# Patient Record
Sex: Male | Born: 1980 | Race: Asian | Hispanic: No | Marital: Married | Smoking: Never smoker
Health system: Southern US, Community
[De-identification: ages and names within clinical notes are randomized; demographics above are authoritative.]

## PROBLEM LIST (undated history)

## (undated) DIAGNOSIS — I509 Heart failure, unspecified: Secondary | ICD-10-CM

## (undated) DIAGNOSIS — I219 Acute myocardial infarction, unspecified: Secondary | ICD-10-CM

## (undated) HISTORY — DX: Acute myocardial infarction, unspecified: I21.9

## (undated) HISTORY — DX: Heart failure, unspecified: I50.9

---

## 2012-07-31 ENCOUNTER — Ambulatory Visit: Payer: BC Managed Care – PPO

## 2012-07-31 ENCOUNTER — Ambulatory Visit (INDEPENDENT_AMBULATORY_CARE_PROVIDER_SITE_OTHER): Payer: BC Managed Care – PPO | Admitting: Emergency Medicine

## 2012-07-31 VITALS — BP 100/66 | HR 70 | Temp 97.2°F | Resp 16 | Ht 65.5 in | Wt 114.4 lb

## 2012-07-31 DIAGNOSIS — R109 Unspecified abdominal pain: Secondary | ICD-10-CM

## 2012-07-31 DIAGNOSIS — R1013 Epigastric pain: Secondary | ICD-10-CM

## 2012-07-31 DIAGNOSIS — B9681 Helicobacter pylori [H. pylori] as the cause of diseases classified elsewhere: Secondary | ICD-10-CM

## 2012-07-31 LAB — POCT CBC
Hemoglobin: 13.4 g/dL — AB (ref 14.1–18.1)
Lymph, poc: 2.7 (ref 0.6–3.4)
MCH, POC: 20.8 pg — AB (ref 27–31.2)
MCHC: 30.8 g/dL — AB (ref 31.8–35.4)
MID (cbc): 0.5 (ref 0–0.9)
MPV: 9.5 fL (ref 0–99.8)
POC Granulocyte: 3.7 (ref 2–6.9)
POC LYMPH PERCENT: 39.1 %L (ref 10–50)
POC MID %: 6.8 %M (ref 0–12)
Platelet Count, POC: 291 10*3/uL (ref 142–424)
RDW, POC: 17.1 %
WBC: 6.9 10*3/uL (ref 4.6–10.2)

## 2012-07-31 MED ORDER — LANSOPRAZOLE 30 MG PO CPDR: 30.0000 mg | DELAYED_RELEASE_CAPSULE | Freq: Every day | ORAL | Status: DC

## 2012-07-31 NOTE — Progress Notes (Signed)
[facility] [address], [place] [address] [phone]  Date:  07/31/2012   Name:  Dustin White   DOB:  11-Nov-1980   MRN:  [id number]  PCP:  No primary provider on file.    Chief Complaint: Abdominal Pain   History of Present Illness:  Dustin White is a 31 y.o. very pleasant male patient who presents with the following:  Has one week history of pain in LUQ and left chest that worsens with food and causes vomiting if he eats too much.  Nauseated and vomiting.  Some loose stools.  No blood mucous or pus in stools.  No ETOH use, no XS ASA or NSAID.  No xs caffeine.  No history of prior episodes or ulcer disease.  There is no problem list on file for this patient.   Past Medical History  Diagnosis Date  . Myocardial infarction   . CHF (congestive heart failure)     History reviewed. No pertinent past surgical history.  History  Substance Use Topics  . Smoking status: Never Smoker   . Smokeless tobacco: Not on file  . Alcohol Use: No    History reviewed. No pertinent family history.  No Known Allergies  Medication list has been reviewed and updated.  No current outpatient prescriptions on file prior to visit.    Review of Systems:  GEN: WDWN, NAD, Non-toxic, A & O x 3 HEENT: Atraumatic, Normocephalic. Neck supple. No masses, No LAD. Ears and Nose: No external deformity. CV: RRR, No M/G/R. No JVD. No thrill. No extra heart sounds. PULM: CTA B, no wheezes, crackles, rhonchi. No retractions. No resp. distress. No accessory muscle use. ABD: S, tender across upper abdomen, ND, +BS. No rebound. No HSM. EXTR: No c/c/e NEURO Normal gait.  PSYCH: Normally interactive. Conversant. Not depressed or anxious appearing.  Calm demeanor.    Physical Examination: Filed Vitals:   07/31/12 1636  BP: 100/66  Pulse: 70  Temp: 97.2 F (36.2 C)  Resp: 16   Filed Vitals:   07/31/12 1636  Height: 5' 5.5" (1.664 m)  Weight: 114 lb 6.4 oz (51.891 kg)    Body mass index is 18.75 kg/(m^2). Ideal Body Weight: Weight in (lb) to have BMI = 25: 152.2   GEN: WDWN, NAD, Non-toxic, A & O x 3 HEENT: Atraumatic, Normocephalic. Neck supple. No masses, No LAD. Ears and Nose: No external deformity. CV: RRR, No M/G/R. No JVD. No thrill. No extra heart sounds. PULM: CTA B, no wheezes, crackles, rhonchi. No retractions. No resp. distress. No accessory muscle use. ABD: S, tender across upper abdomen worse in RUQ, ND, +BS. No rebound. No HSM. EXTR: No c/c/e NEURO Normal gait.  PSYCH: Normally interactive. Conversant. Not depressed or anxious appearing.  Calm demeanor.     Assessment and Plan:  Abdominal pain Will check labs and if negative obtain ultrasound of abdomen Empirically start on prevacid Follow up in one week  Carmelina Dane, MD  [facility] reading (PRIMARY) by  Dr. Dareen Piano.  Unremarkable abdomen and chest.    Results for orders placed in visit on 07/31/12  POCT CBC      Component Value Range   WBC 6.9  4.6 - 10.2 K/uL   Lymph, poc 2.7  0.6 - 3.4   POC LYMPH PERCENT 39.1  10 - 50 %L   MID (cbc) 0.5  0 - 0.9   POC MID % 6.8  0 - 12 %M   POC Granulocyte 3.7  2 - 6.9   Granulocyte percent 54.1  37 - 80 %G   RBC 6.43 (*) 4.69 - 6.13 M/uL   Hemoglobin 13.4 (*) 14.1 - 18.1 g/dL   HCT, POC 16.1  09.6 - 53.7 %   MCV 67.7 (*) 80 - 97 fL   MCH, POC 20.8 (*) 27 - 31.2 pg   MCHC 30.8 (*) 31.8 - 35.4 g/dL   RDW, POC 04.5     Platelet Count, POC 291  142 - 424 K/uL   MPV 9.5  0 - 99.8 fL

## 2012-07-31 NOTE — Patient Instructions (Signed)
?  au B?ng (Abdominal Pain) ?au b?ng ho?c ?au d? dy c th? do r?t nhi?u nguyn nhn. Bc s? c?a b?n quy?t ??nh m?c ?? nghim tr?ng c?a c?n ?au c?a b?n b?ng cch khm, v c th? lm xt nghi?m mu v ch?p X-quang. R?t nhi?u tr??ng h?p c th? ???c theo di v ?i?u tr? t?i nh. ?a s? ?au b?ng ? tr? em l b?nh ch?c n?ng. ?i?u ny ny c ngh?a ?au b?ng khng ph?i l do b?nh v c th? s? ?? m khng c?n ?i?u tr?Mickie Hillier nhin, trong nhi?u tr??ng h?p, ph?i m?t nhi?u th?i gian h?n tr??c khi nguyn nhn r rng c?a c?n ?au c th? ???c tm th?y. Tr??c th?i ?i?m ?, c th? khng bi?t l b?n c c?n lm thm cc xt nghi?m, ho?c c c?n nh?p vi?n hay ph?u thu?t khng. H??NG D?N CH?M [place] T?I NH  Khng u?ng ho?c cho u?ng thu?c nhu?n trng, tr? khi ???c bc s? yu c?u.  Ch? u?ng thu?c gi?m ?au n?u ???c bc s? yu c?u.  Ch? dng cc thu?c k ??n ho?c khng k ??n ?? gi?m ?au, kh ch?u ho?c s?t theo ch? d?n c?a Bc s?.  C? g?ng n theo ch? ? n u?ng c ch?t l?ng khng c ci - n?c th?t h?m, n?c th?t ng, ch, ho?c n?c trong mi?n l theo s? ch? d?n c?a chuyn gia ch?m [place] y t? c?a b?n. B?n c th? d?n d?n chuy?n sang ch? ?? ?n nh?t n?u b?nh nhn tiu ha ???c. HY NGAY L?P T?C THAM V?N V?I CHUYN GIA Y T? N?U:  Khng h?t ?au.  B?n b? s?t.  Nn m?a nhi?u l?n.  B?t ??u ?au m?t ch? ? gc bn ph?i b?ng d??i (c th? l b?nh vim ru?t th?a), ho?c gc bn tri b?ng d??i ? ng??i l?n (c th? l vim ru?t k?t ho?c vim ti th?a).  i ngoi ra phn c mu (phn mu ? ti ho?c en nh h?c-n). HY CH?C CH?N R?NG B?N:  Hi?u r nh?ng h??ng d?n khi xu?t vi?n.  S? theo di tnh tr?ng b?nh c?a b?n.  S? ??n khm b?nh ngay l?p t?c nh? ? ???c h??ng d?n. Document Released: 07/23/2005 Document Revised: 10/15/2011 [facility] Patient Information 2013 [place], [place].

## 2012-08-01 ENCOUNTER — Telehealth: Payer: Self-pay | Admitting: Emergency Medicine

## 2012-08-01 LAB — COMPREHENSIVE METABOLIC PANEL
ALT: 21 U/L (ref 0–53)
AST: 19 U/L (ref 0–37)
Alkaline Phosphatase: 54 U/L (ref 39–117)
CO2: 29 mEq/L (ref 19–32)
Creat: 0.96 mg/dL (ref 0.50–1.35)
Sodium: 137 mEq/L (ref 135–145)
Total Bilirubin: 0.6 mg/dL (ref 0.3–1.2)
Total Protein: 7.6 g/dL (ref 6.0–8.3)

## 2012-08-01 MED ORDER — AMOXICILL-CLARITHRO-LANSOPRAZ PO MISC: ORAL | Status: DC

## 2012-08-01 NOTE — Addendum Note (Signed)
Addended by: Carmelina Dane on: 08/01/2012 02:16 PM   Modules accepted: Orders

## 2012-08-01 NOTE — Telephone Encounter (Signed)
Please let patient know he has a prescription to pick up for a prevpak.  We should see him back when he completes the medication.  thanks

## 2012-08-03 NOTE — Telephone Encounter (Signed)
He has gotten the Rx already

## 2012-09-20 ENCOUNTER — Other Ambulatory Visit: Payer: Self-pay | Admitting: Emergency Medicine

## 2013-02-03 ENCOUNTER — Ambulatory Visit (INDEPENDENT_AMBULATORY_CARE_PROVIDER_SITE_OTHER): Payer: BC Managed Care – PPO | Admitting: Internal Medicine

## 2013-02-03 VITALS — BP 100/72 | HR 61 | Temp 98.0°F | Resp 18 | Ht 65.0 in | Wt 109.0 lb

## 2013-02-03 DIAGNOSIS — R197 Diarrhea, unspecified: Secondary | ICD-10-CM

## 2013-02-03 DIAGNOSIS — Z603 Acculturation difficulty: Secondary | ICD-10-CM

## 2013-02-03 DIAGNOSIS — Z8619 Personal history of other infectious and parasitic diseases: Secondary | ICD-10-CM

## 2013-02-03 DIAGNOSIS — R634 Abnormal weight loss: Secondary | ICD-10-CM

## 2013-02-03 DIAGNOSIS — Z609 Problem related to social environment, unspecified: Secondary | ICD-10-CM

## 2013-02-03 DIAGNOSIS — Z789 Other specified health status: Secondary | ICD-10-CM

## 2013-02-03 LAB — POCT CBC

## 2013-02-03 LAB — POCT UA - MICROSCOPIC ONLY
Bacteria, U Microscopic: NEGATIVE
Casts, Ur, LPF, POC: NEGATIVE
WBC, Ur, HPF, POC: NEGATIVE
Yeast, UA: NEGATIVE

## 2013-02-03 LAB — TSH: TSH: 0.609 u[IU]/mL (ref 0.350–4.500)

## 2013-02-03 LAB — POCT URINALYSIS DIPSTICK
Blood, UA: NEGATIVE
Ketones, UA: NEGATIVE
Protein, UA: NEGATIVE
Spec Grav, UA: >=1.03
pH, UA: 5.5

## 2013-02-03 MED ORDER — MEBENDAZOLE 100 MG PO CHEW: 100.0000 mg | CHEWABLE_TABLET | Freq: Two times a day (BID) | ORAL | Status: DC

## 2013-02-03 NOTE — Patient Instructions (Addendum)
Intestinal Parasites (Worms) Your exam shows the presence of intestinal worms. Roundworms and tapeworms are common and cause a variety of symptoms: stomach cramps, diarrhea, nausea, passage of worms in the stool, and allergic reactions. Roundworms can cause hives, asthma, pneumonia, and blockage of the bowels.  Tapeworms usually come from undercooked meat (beef, pork, and fish). Roundworms are picked up through the soil. It is very important that you take the medicine you have been prescribed to destroy these parasites. It is also very important that you see your doctor in follow-up to evaluate the results of your treatment. Sometimes a second course of medicine is needed to get rid of the parasite worms. Document Released: 07/23/2005 Document Revised: 10/15/2011 Document Reviewed: 01/13/2007 [facility] Patient Information 2014 [place], [place]. Diarrhea Diarrhea is frequent loose and watery bowel movements. It can cause you to feel weak and dehydrated. Dehydration can cause you to become tired and thirsty, have a dry mouth, and have decreased urination that often is dark yellow. Diarrhea is a sign of another problem, most often an infection that will not last long. In most cases, diarrhea typically lasts 2 3 days. However, it can last longer if it is a sign of something more serious. It is important to treat your diarrhea as directed by your caregive to lessen or prevent future episodes of diarrhea. CAUSES  Some common causes include:  Gastrointestinal infections caused by viruses, bacteria, or parasites.  Food poisoning or food allergies.  Certain medicines, such as antibiotics, chemotherapy, and laxatives.  Artificial sweeteners and fructose.  Digestive disorders. HOME CARE INSTRUCTIONS  Ensure adequate fluid intake (hydration): have 1 cup (8 oz) of fluid for each diarrhea episode. Avoid fluids that contain simple sugars or sports drinks, fruit juices, whole milk products, and sodas. Your urine  should be clear or pale yellow if you are drinking enough fluids. Hydrate with an oral rehydration solution that you can purchase at pharmacies, retail stores, and online. You can prepare an oral rehydration solution at home by mixing the following ingredients together:    tsp table salt.   tsp baking soda.   tsp salt substitute containing potassium chloride.  1  tablespoons sugar.  1 L (34 oz) of water.  Certain foods and beverages may increase the speed at which food moves through the gastrointestinal (GI) tract. These foods and beverages should be avoided and include:  Caffeinated and alcoholic beverages.  High-fiber foods, such as raw fruits and vegetables, nuts, seeds, and whole grain breads and cereals.  Foods and beverages sweetened with sugar alcohols, such as xylitol, sorbitol, and mannitol.  Some foods may be well tolerated and may help thicken stool including:  Starchy foods, such as rice, toast, pasta, low-sugar cereal, oatmeal, grits, baked potatoes, crackers, and bagels.  Bananas.  Applesauce.  Add probiotic-rich foods to help increase healthy bacteria in the GI tract, such as yogurt and fermented milk products.  Wash your hands well after each diarrhea episode.  Only take over-the-counter or prescription medicines as directed by your caregiver.  Take a warm bath to relieve any burning or pain from frequent diarrhea episodes. SEEK IMMEDIATE MEDICAL CARE IF:   You are unable to keep fluids down.  You have persistent vomiting.  You have blood in your stool, or your stools are black and tarry.  You do not urinate in 6 8 hours, or there is only a small amount of very dark urine.  You have abdominal pain that increases or localizes.  You have weakness, dizziness,  confusion, or lightheadedness.  You have a severe headache.  Your diarrhea gets worse or does not get better.  You have a fever or persistent symptoms for more than 2 3 days.  You have a fever  and your symptoms suddenly get worse. MAKE SURE YOU:   Understand these instructions.  Will watch your condition.  Will get help right away if you are not doing well or get worse. Document Released: 07/13/2002 Document Revised: 07/09/2012 Document Reviewed: 03/30/2012 [facility] Patient Information 2014 [place], [place]. Intestinal Parasites (Worms) Your exam shows the presence of intestinal worms. Roundworms and tapeworms are common and cause a variety of symptoms: stomach cramps, diarrhea, nausea, passage of worms in the stool, and allergic reactions. Roundworms can cause hives, asthma, pneumonia, and blockage of the bowels.  Tapeworms usually come from undercooked meat (beef, pork, and fish). Roundworms are picked up through the soil. It is very important that you take the medicine you have been prescribed to destroy these parasites. It is also very important that you see your doctor in follow-up to evaluate the results of your treatment. Sometimes a second course of medicine is needed to get rid of the parasite worms. Document Released: 07/23/2005 Document Revised: 10/15/2011 Document Reviewed: 01/13/2007 [facility] Patient Information 2014 [place], [place].

## 2013-02-03 NOTE — Progress Notes (Signed)
Subjective:    Patient ID: Dustin White, male    DOB: [birth year], 32 y.o.   MRN: [id number]  HPI Interpretor gives story, pt. Speaks no english. Has weight loss, diarrhea after eating and sees worms in his stool. Was txed in [facility] for worms with a pill.   Review of Systems Difficult hx    Objective:   Physical Exam  Vitals reviewed. Constitutional: He is oriented to person, place, and time. He appears well-developed and well-nourished. No distress.  HENT:  Mouth/Throat: Oropharynx is clear and moist.  Eyes: EOM are normal. No scleral icterus.  Neck: Normal range of motion. Neck supple. No thyromegaly present.  Cardiovascular: Normal rate, regular rhythm and normal heart sounds.   Pulmonary/Chest: Effort normal and breath sounds normal.  Abdominal: Soft. Normal appearance. He exhibits no distension and no mass. Bowel sounds are increased. There is no hepatosplenomegaly. There is no tenderness. There is no CVA tenderness. No hernia. Hernia confirmed negative in the right inguinal area and confirmed negative in the left inguinal area.  Musculoskeletal: Normal range of motion.  Lymphadenopathy:    He has no cervical adenopathy.  Neurological: He is alert and oriented to person, place, and time. He exhibits normal muscle tone. Coordination normal.  Skin: No rash noted.  Psychiatric: He has a normal mood and affect. His behavior is normal.   Stool for O/P Results for orders placed in visit on 02/03/13  POCT CBC      Result Value Range   WBC    4.6 - 10.2 K/uL   Lymph, poc    0.6 - 3.4   POC LYMPH PERCENT    10 - 50 %L   MID (cbc)    0 - 0.9   POC MID %    0 - 12 %M   POC Granulocyte    2 - 6.9   Granulocyte percent    37 - 80 %G   RBC    4.69 - 6.13 M/uL   Hemoglobin    14.1 - 18.1 g/dL   HCT, POC    40.9 - 81.1 %   MCV    80 - 97 fL   MCH, POC    27 - 31.2 pg   MCHC    31.8 - 35.4 g/dL   RDW, POC       Platelet Count, POC    142 - 424 K/uL   MPV    0 - 99.8 fL  POCT UA -  MICROSCOPIC ONLY      Result Value Range   WBC, Ur, HPF, POC neg     RBC, urine, microscopic neg     Bacteria, U Microscopic neg     Mucus, UA trace     Epithelial cells, urine per micros neg     Crystals, Ur, HPF, POC neg     Casts, Ur, LPF, POC neg     Yeast, UA neg    POCT URINALYSIS DIPSTICK      Result Value Range   Color, UA dk yellow     Clarity, UA clear     Glucose, UA neg     Bilirubin, UA neg     Ketones, UA neg     Spec Grav, UA >=1.030     Blood, UA neg     pH, UA 5.5     Protein, UA neg     Urobilinogen, UA 0.2     Nitrite, UA neg     Leukocytes, UA Negative  Assessment & Plan:  Weightt loss 5lbs 7 months Diarrhea and Abdominal distress Hx of Worms/Mebendazole 100mg  bid for 5d

## 2013-02-04 LAB — COMPREHENSIVE METABOLIC PANEL
BUN: 18 mg/dL (ref 6–23)
CO2: 25 mEq/L (ref 19–32)
Glucose, Bld: 84 mg/dL (ref 70–99)
Sodium: 134 mEq/L — ABNORMAL LOW (ref 135–145)
Total Bilirubin: 1.6 mg/dL — ABNORMAL HIGH (ref 0.3–1.2)
Total Protein: 6.8 g/dL (ref 6.0–8.3)

## 2013-02-04 LAB — CBC WITH DIFFERENTIAL/PLATELET
Basophils Absolute: 0 10*3/uL (ref 0.0–0.1)
Basophils Relative: 0 % (ref 0–1)
Hemoglobin: 12.7 g/dL — ABNORMAL LOW (ref 13.0–17.0)
MCHC: 33.6 g/dL (ref 30.0–36.0)
Monocytes Relative: 10 % (ref 3–12)
Neutro Abs: 2.7 10*3/uL (ref 1.7–7.7)
Neutrophils Relative %: 54 % (ref 43–77)
RDW: 17.7 % — ABNORMAL HIGH (ref 11.5–15.5)
WBC: 5.1 10*3/uL (ref 4.0–10.5)

## 2013-02-04 LAB — OVA AND PARASITE SCREEN: OP: NONE SEEN

## 2013-02-08 ENCOUNTER — Ambulatory Visit (INDEPENDENT_AMBULATORY_CARE_PROVIDER_SITE_OTHER): Payer: BC Managed Care – PPO | Admitting: Internal Medicine

## 2013-02-08 VITALS — BP 98/60 | HR 63 | Temp 97.8°F | Resp 18 | Ht 65.0 in | Wt 111.0 lb

## 2013-02-08 DIAGNOSIS — R634 Abnormal weight loss: Secondary | ICD-10-CM

## 2013-02-08 DIAGNOSIS — Z8619 Personal history of other infectious and parasitic diseases: Secondary | ICD-10-CM

## 2013-02-08 NOTE — Progress Notes (Signed)
  Subjective:    Patient ID: Dustin White, male    DOB: [birth year], 32 y.o.   MRN: [id number]  HPI Has not been able to get mebendazole at pharm we sent electronic order. Still symptomatic and occ sees worms in stool. 1st O/P neg, will do 2nd Language barrier  Review of Systems Weight stabilized and gained a little, is eating    Objective:   Physical Exam  Constitutional: He is oriented to person, place, and time. He appears well-nourished. No distress.  HENT:  Mouth/Throat: Oropharynx is clear and moist.  Eyes: EOM are normal. No scleral icterus.  Cardiovascular: Normal rate.   Pulmonary/Chest: Effort normal.  Neurological: He is alert and oriented to person, place, and time. Coordination normal.  Psychiatric: He has a normal mood and affect.    Do 2nd O/P      Assessment & Plan:  Hand written rx for mebendazole Hemoglobinapathy info given

## 2013-02-08 NOTE — Patient Instructions (Signed)
Hemoglobin E, Hemoglobin Variants Hemoglobin is made up of heme (the iron-containing portion of hemoglobin) and globin (amino acid chains that form a protein). Hemoglobin (Hgb) molecules are found in all red blood cells. They bind oxygen in the lungs, carry the oxygen throughout the body, and release it to the body's cells and tissues. Hemoglobin E is one of the most common hemoglobin variants (abnormal forms of hemoglobin) in the world. It is very prevalent in [place], especially in [place], [place], and [place] and in individuals of [place] Asian descent. Hemoglobin E also is due to a mutation in the gene that creates the beta ( ) chain. People who are homozygous for Hgb E (have two copies of  E) have a mild hemolytic anemia (caused by premature removal of red blood cells from the circulation), microcytosis (small red blood cells), and mild enlargement of the spleen. A single copy of hemoglobin E does not cause symptoms unless it is combined with another mutation, such as one for beta thalassemia trait. Normal hemoglobin types include:   Hemoglobin A (about 95% - 98%): Hgb A contains two alpha ( ) chains and two beta ( ) chains  Hgb A2 (2% - 3%): has two alpha () and two delta () chains  Hgb F (up to 2%): the primary hemoglobin produced by the fetus during gestation; its production usually falls to a low level shortly after birth; Hgb F has two alpha ( ) and two gamma ( ) chains Hemoglobin variants are abnormal forms of hemoglobin that occur when changes (point mutations, deletions) in the globin genes cause changes in the amino acids that make up the globin protein. These changes may affect the structure of the hemoglobin, its behavior, its production rate, and/or its stability. Several hundred hemoglobin variants have been documented; however, only a few are common and clinically significant. The majority of these are beta chain variants. These variants are inherited in an autosomal recessive  fashion. A person inherits one copy of each beta globin gene from each parent. If one normal beta gene and one abnormal beta gene are inherited, the person is said to be a carrier or to be heterozygous for the abnormal hemoglobin. The abnormal gene can be passed on to any offspring but does not cause symptoms or health concerns in the carrier. If two abnormal beta genes of the same type are inherited, the person is considered to have the disease and is homozygous for the abnormal hemoglobin. A copy of the abnormal beta gene will be passed on to any offspring. If two abnormal beta genes of different types are inherited, the person is doubly or compound heterozygous. One of the abnormal beta genes will be passed on to each offspring. MEANING OF TEST  Your caregiver will go over the test results with you and discuss the importance and meaning of your results, as well as treatment options and the need for additional tests if necessary. OBTAINING THE TEST RESULTS It is your responsibility to obtain your test results. Ask the lab or department performing the test when and how you will get your results. Document Released: 08/24/2004 Document Revised: 10/15/2011 Document Reviewed: 07/03/2008 [facility] Patient Information 2014 [place], [place].

## 2013-02-09 ENCOUNTER — Telehealth: Payer: Self-pay | Admitting: Family Medicine

## 2013-02-09 MED ORDER — METRONIDAZOLE 500 MG PO TABS: 500.0000 mg | ORAL_TABLET | Freq: Two times a day (BID) | ORAL | Status: DC

## 2013-02-09 NOTE — Telephone Encounter (Signed)
D/C mebendazole due to unavailable. Meds ordered this encounter  Medications  . metroNIDAZOLE (FLAGYL) 500 MG tablet    Sig: Take 1 tablet (500 mg total) by mouth 2 (two) times daily with a meal. DO NOT CONSUME ALCOHOL WHILE TAKING THIS MEDICATION.    Dispense:  14 tablet    Refill:  0    Order Specific Question:  Supervising Provider    Answer:  DOOLITTLE, ROBERT P [[id number]]

## 2013-02-09 NOTE — Telephone Encounter (Signed)
Pt was prescribed mebendazole 100 mg but it has been discontinued. Please advise change

## 2013-02-10 LAB — OVA AND PARASITE EXAMINATION: OP: NONE SEEN

## 2013-02-11 ENCOUNTER — Telehealth: Payer: Self-pay

## 2013-02-11 ENCOUNTER — Encounter: Payer: Self-pay | Admitting: Family Medicine

## 2013-02-11 ENCOUNTER — Other Ambulatory Visit: Payer: Self-pay | Admitting: Radiology

## 2013-02-11 MED ORDER — MEBENDAZOLE 100 MG PO CHEW: 100.0000 mg | CHEWABLE_TABLET | Freq: Two times a day (BID) | ORAL | Status: DC

## 2013-02-11 NOTE — Telephone Encounter (Signed)
Called he can get med ordered by Dr Perrin Maltese at [facility], sent there.

## 2013-02-11 NOTE — Telephone Encounter (Signed)
PATIENT'S FRIEND AND INTERPRETOR HAS SOME QUESTIONS ABOUT THE MEDICATION DR. Perrin Maltese GAVE Dustin White? BEST PHONE [phone] (PH[facility])    MBC

## 2013-02-11 NOTE — Telephone Encounter (Signed)
Clarified with pharmacy / Dr Perrin Maltese wants patient to use Flagyl., mebendizole not available.

## 2014-03-20 IMAGING — CR DG ABDOMEN ACUTE W/ 1V CHEST
3 series · 3 of 3 positions shown · non-contrast
Comparison: 10/20/2007 CT

CLINICAL DATA: 31-year-old male with abdominal pain and vomiting.

ACUTE ABDOMEN SERIES (ABDOMEN 2 VIEW & CHEST 1 VIEW)

[PA]
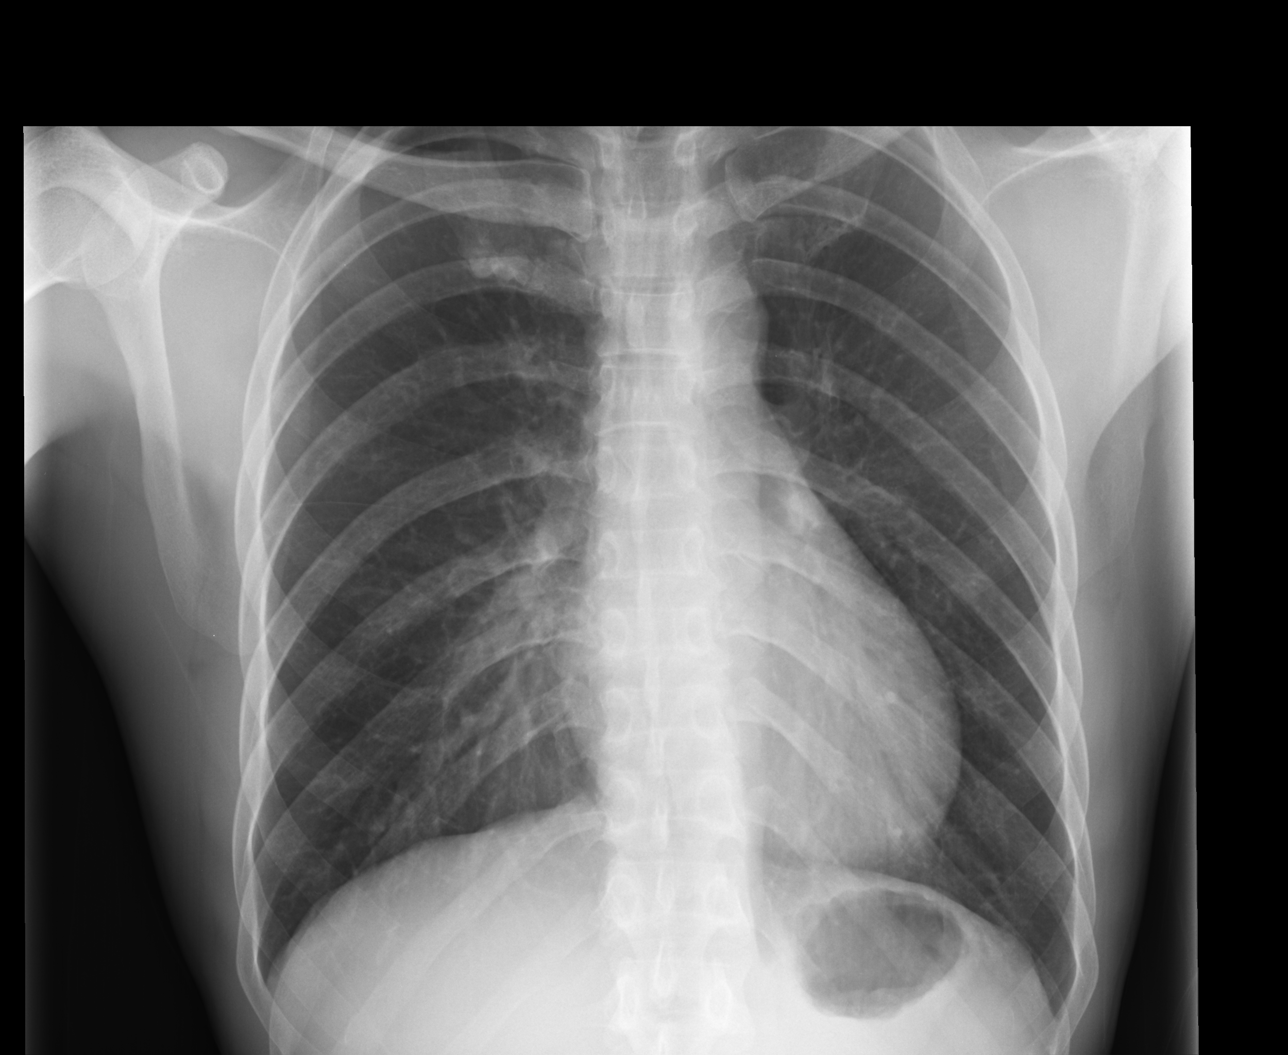

[AP (1 of 2)]
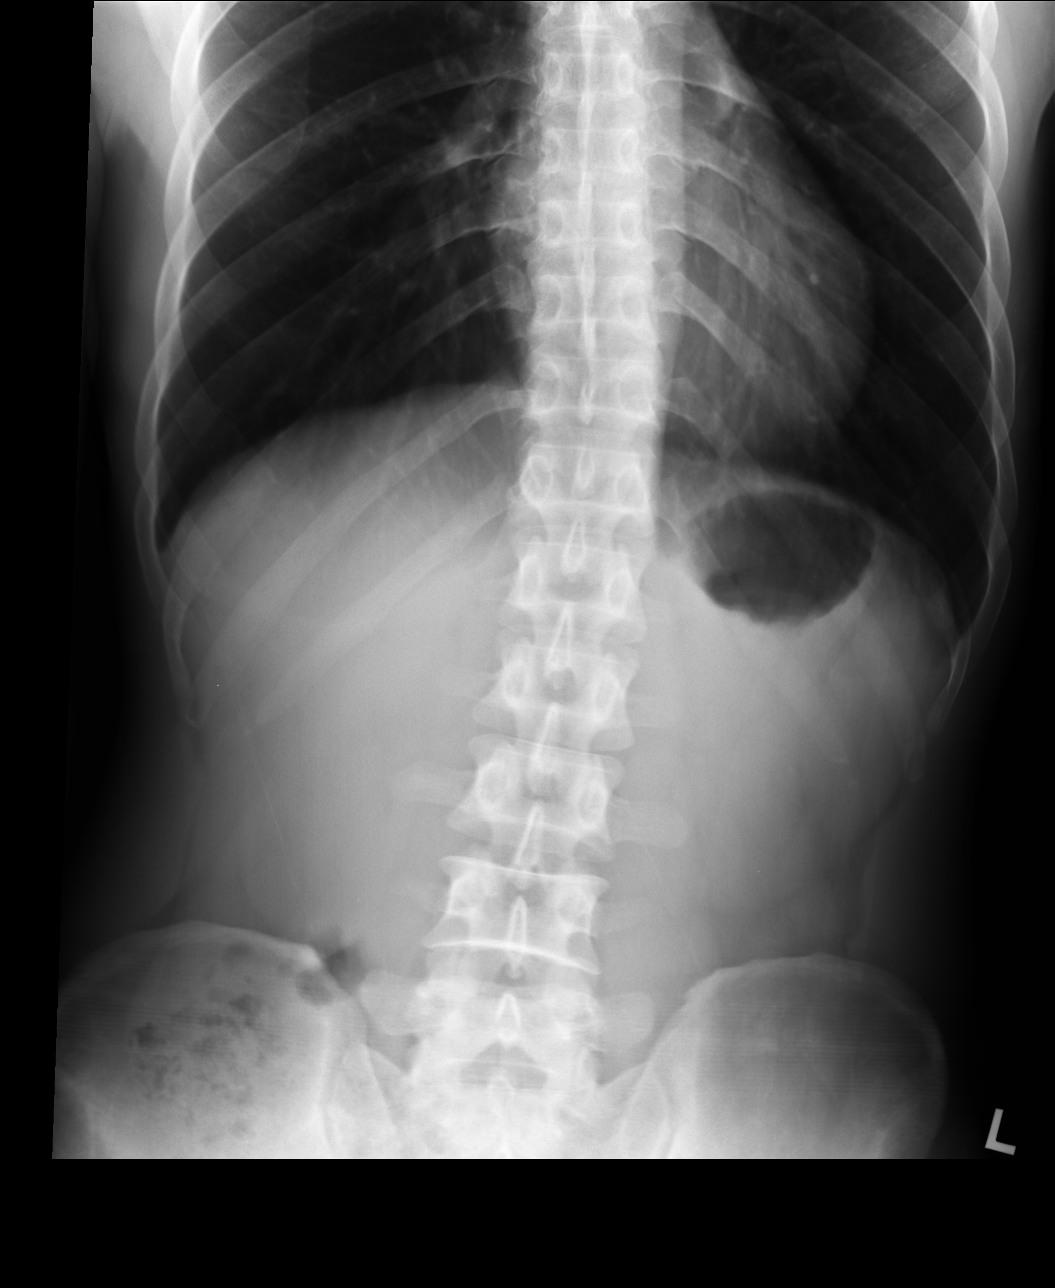

[AP (2 of 2)]
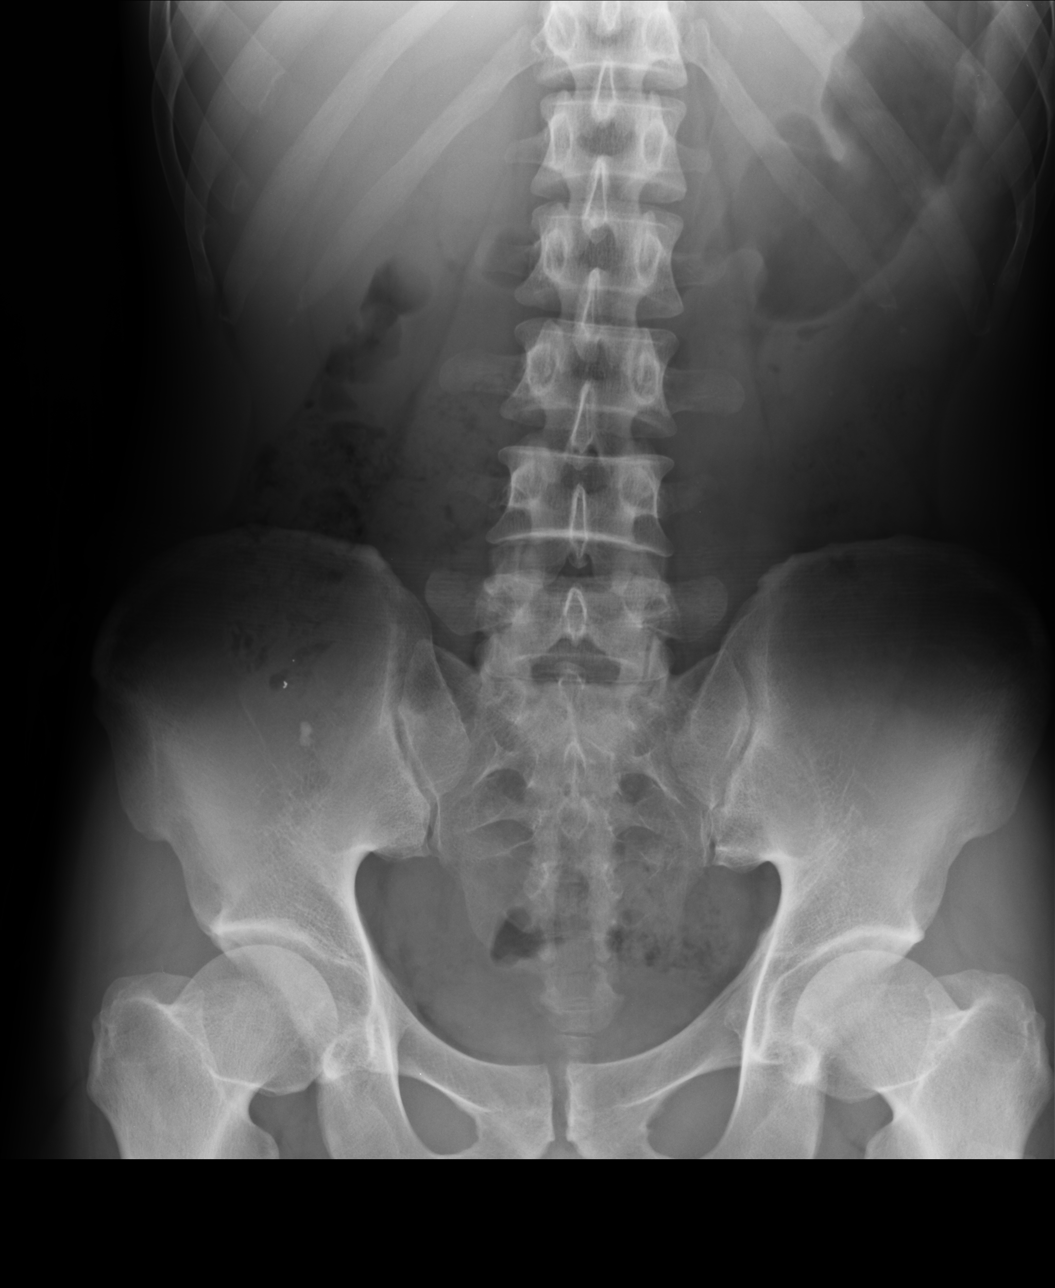

[3 of 3 positions shown; findings below may reference images not displayed]

FINDINGS: The cardiomediastinal silhouette is unremarkable.
There is no evidence of airspace disease, pleural effusion or
pneumothorax.

The bowel gas pattern is unremarkable.
No suspicious calcifications are identified.
There is no evidence of bowel obstruction or pneumoperitoneum.
The bony structures are within normal limits.
IMPRESSION: No evidence of acute or significant abnormality.

## 2014-08-02 ENCOUNTER — Ambulatory Visit
Admission: RE | Admit: 2014-08-02 | Discharge: 2014-08-02 | Disposition: A | Discharge status: Home / Self Care | Payer: No Typology Code available for payment source | Source: Ambulatory Visit | Attending: Infectious Disease | Admitting: Infectious Disease

## 2014-08-02 ENCOUNTER — Other Ambulatory Visit: Payer: Self-pay | Admitting: Infectious Disease

## 2014-08-02 DIAGNOSIS — Z111 Encounter for screening for respiratory tuberculosis: Secondary | ICD-10-CM

## 2015-07-23 ENCOUNTER — Ambulatory Visit (INDEPENDENT_AMBULATORY_CARE_PROVIDER_SITE_OTHER): Payer: BLUE CROSS/BLUE SHIELD | Admitting: Physician Assistant

## 2015-07-23 VITALS — BP 108/72 | HR 83 | Temp 98.2°F | Resp 16 | Ht 66.0 in | Wt 120.0 lb

## 2015-07-23 DIAGNOSIS — R21 Rash and other nonspecific skin eruption: Secondary | ICD-10-CM

## 2015-07-23 MED ORDER — TRIAMCINOLONE ACETONIDE 0.5 % EX CREA: 1.0000 "application " | TOPICAL_CREAM | Freq: Two times a day (BID) | CUTANEOUS | Status: AC

## 2015-07-23 NOTE — Progress Notes (Signed)
   Dustin White  MRN: [id number] DOB: 11/25/1980  Subjective:  Pt presents to clinic with a rash on his right inner thigh.  Started small and now is bigger.  It is uncomfortable and itchy.  He has been using a cream that he got at the pharmacy but he is not sure what it is. He had a similar rash on his left arm that he used to same cream and it went away but it is not helping his current rash.  There are no active problems to display for this patient.   No current outpatient prescriptions on file prior to visit.   No current facility-administered medications on file prior to visit.    No Known Allergies  Review of Systems  Skin: Positive for rash.   Objective:  BP 108/72 mmHg  Pulse 83  Temp(Src) 98.2 F (36.8 C)  Resp 16  Ht 5\' 6"  (1.676 m)  Wt 120 lb (54.432 kg)  BMI 19.38 kg/m2  SpO2 97%  Physical Exam  Constitutional: He is oriented to person, place, and time and well-developed, well-nourished, and in no distress.  HENT:  Head: Normocephalic and atraumatic.  Right Ear: External ear normal.  Left Ear: External ear normal.  Eyes: Conjunctivae are normal.  Neck: Normal range of motion.  Pulmonary/Chest: Effort normal.  Neurological: He is alert and oriented to person, place, and time. Gait normal.  Skin: Skin is warm and dry. Rash noted. Rash is maculopapular.     Maculopapular erythematous rash with some scabbed lesions - no central clearing  Psychiatric: Mood, memory, affect and judgment normal.    Assessment and Plan :  Rash - Plan: triamcinolone cream (KENALOG) 0.5 %, Care order/instruction   This appears to be eczema - we will treat with topical steroids and have him use an ointment to help decrease the friction of the area.  He will f/u if it is not getting better.  Benny LennertSarah Lou Loewe PA-C  [facility] 07/23/2015 3:54 PM

## 2015-07-23 NOTE — Patient Instructions (Signed)
Use cream on the rash and then also before work use some vaseline to help decrease the friction from your pants from making the rash worse

## 2016-03-21 IMAGING — CR DG CHEST 1V
1 series · 1 of 1 positions shown · non-contrast
Comparison: Radiographs 07/31/2012.

CLINICAL DATA: TB exposure.  No current symptoms.

EXAM:
CHEST - 1 VIEW

[view not recorded]
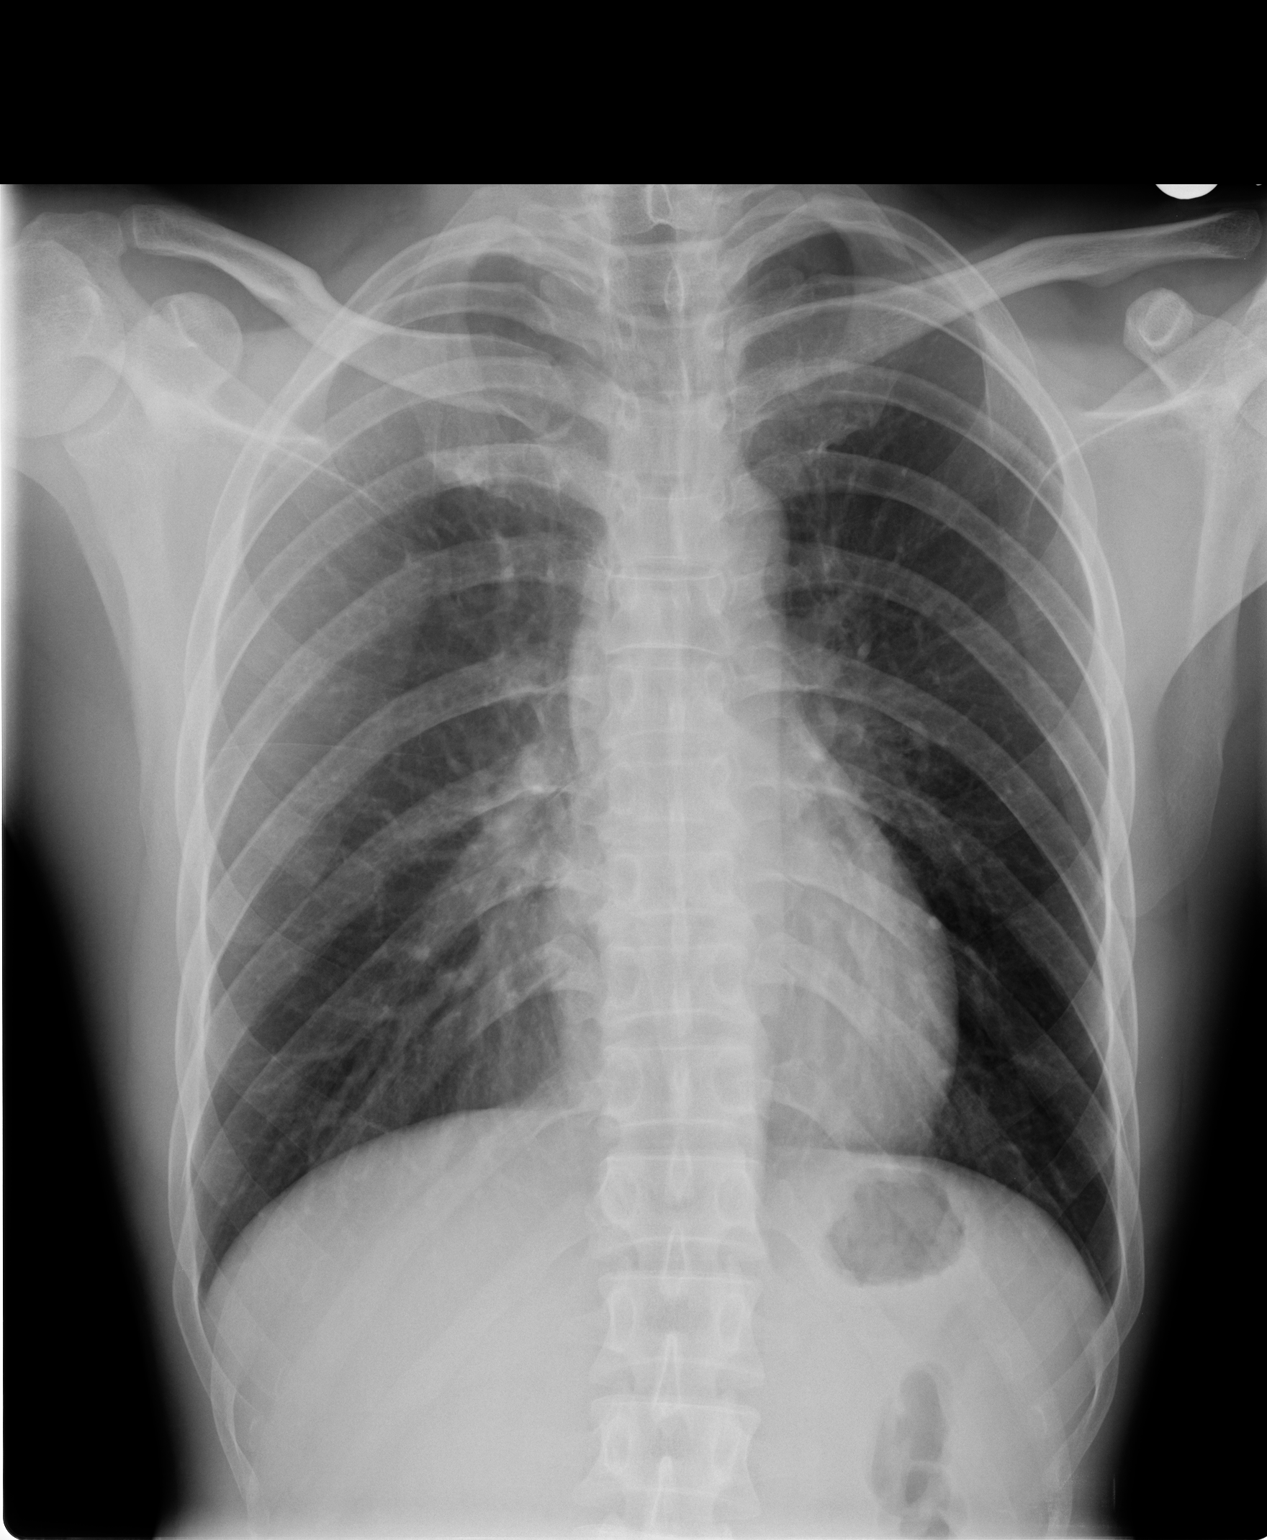

[1 of 1 positions shown; findings below may reference images not displayed]

FINDINGS: The heart size and mediastinal contours are stable. Asymmetric
density over the upper right hemithorax is attributed to asymmetric
positioning and overlap of the right scapula. The lungs appear
clear. There is no pleural effusion or pneumothorax. No acute
osseous findings are evident.
IMPRESSION: No active cardiopulmonary process. No radiographic evidence of
tuberculosis.

## 2019-03-30 ENCOUNTER — Other Ambulatory Visit: Payer: Self-pay

## 2019-03-30 DIAGNOSIS — Z20822 Contact with and (suspected) exposure to covid-19: Secondary | ICD-10-CM

## 2019-03-31 LAB — NOVEL CORONAVIRUS, NAA: SARS-CoV-2, NAA: NOT DETECTED

## 2022-10-18 ENCOUNTER — Ambulatory Visit
Admission: RE | Admit: 2022-10-18 | Discharge: 2022-10-18 | Disposition: A | Discharge status: Home / Self Care | Payer: No Typology Code available for payment source | Source: Ambulatory Visit | Attending: Obstetrics and Gynecology | Admitting: Obstetrics and Gynecology

## 2022-10-18 ENCOUNTER — Other Ambulatory Visit: Payer: Self-pay | Admitting: Obstetrics and Gynecology

## 2022-10-18 DIAGNOSIS — Z201 Contact with and (suspected) exposure to tuberculosis: Secondary | ICD-10-CM
